# Patient Record
Sex: Male | Born: 1995 | Race: Black or African American | Hispanic: No | Marital: Single | Smoking: Never smoker
Health system: Southern US, Community
[De-identification: ages and names within clinical notes are randomized; demographics above are authoritative.]

## PROBLEM LIST (undated history)

## (undated) DIAGNOSIS — J45909 Unspecified asthma, uncomplicated: Secondary | ICD-10-CM

## (undated) HISTORY — PX: NASAL POLYP SURGERY: SHX186

---
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
## 2015-08-16 ENCOUNTER — Other Ambulatory Visit (HOSPITAL_COMMUNITY): Payer: Self-pay | Admitting: Internal Medicine

## 2015-08-16 DIAGNOSIS — R0602 Shortness of breath: Secondary | ICD-10-CM

## 2015-08-27 ENCOUNTER — Encounter (HOSPITAL_COMMUNITY): Payer: Self-pay

## 2015-08-27 ENCOUNTER — Ambulatory Visit (HOSPITAL_COMMUNITY): Payer: Medicaid Other | Attending: Internal Medicine

## 2015-09-10 ENCOUNTER — Inpatient Hospital Stay (HOSPITAL_COMMUNITY): Admission: RE | Admit: 2015-09-10 | Payer: Medicaid Other | Source: Ambulatory Visit

## 2021-11-10 ENCOUNTER — Ambulatory Visit
Admission: EM | Admit: 2021-11-10 | Discharge: 2021-11-10 | Disposition: A | Discharge status: Home / Self Care | Payer: Medicaid Other | Attending: Physician Assistant | Admitting: Physician Assistant

## 2021-11-10 DIAGNOSIS — R300 Dysuria: Secondary | ICD-10-CM | POA: Insufficient documentation

## 2021-11-10 DIAGNOSIS — Z113 Encounter for screening for infections with a predominantly sexual mode of transmission: Secondary | ICD-10-CM | POA: Diagnosis not present

## 2021-11-10 LAB — POCT URINALYSIS DIP (MANUAL ENTRY)
Bilirubin, UA: NEGATIVE
Glucose, UA: NEGATIVE mg/dL
Ketones, POC UA: NEGATIVE mg/dL
Nitrite, UA: NEGATIVE
Spec Grav, UA: >=1.03 — AB (ref 1.010–1.025)
Urobilinogen, UA: 0.2 E.U./dL
pH, UA: 6 (ref 5.0–8.0)

## 2021-11-10 MED ORDER — DOXYCYCLINE HYCLATE 100 MG PO CAPS: 100.0000 mg | ORAL_CAPSULE | Freq: Two times a day (BID) | ORAL | 0 refills | Status: DC

## 2021-11-10 NOTE — ED Triage Notes (Signed)
Pt states he has burning during urination and penile pain today.

## 2021-11-10 NOTE — ED Provider Notes (Signed)
[facility]    CSN: [id number] Arrival date & time: 11/10/21  1019      History   Chief Complaint Chief Complaint  Patient presents with   STD Testing     HPI Stanislaw Acton is a 26 y.o. male.   Patient here today for evaluation of dysuria and penile pain he noticed today.  He states that he did notice some white discharge from his penis earlier this morning.  He denies any genital lesions.  He has not had any fever.  Denies any known exposures to STDs.  The history is provided by the patient.    Past Medical History:  Diagnosis Date   Asthma     There are no problems to display for this patient.   Past Surgical History:  Procedure Laterality Date   NASAL POLYP SURGERY     ORCHIECTOMY Right 12/31/2012   Procedure: ORCHIECTOMY;  Surgeon: Molli Hazard, MD;  Location: [place];  Service: Urology;  Laterality: Right;  right simple orchiectomy; right spermatic cord block;  ultrasound   SCROTAL EXPLORATION N/A 12/31/2012   Procedure: SCROTUM EXPLORATION;  Surgeon: Molli Hazard, MD;  Location: [place];  Service: Urology;  Laterality: N/A;       Home Medications    Prior to Admission medications   Medication Sig Start Date End Date Taking? Authorizing Provider  doxycycline (VIBRAMYCIN) 100 MG capsule Take 1 capsule (100 mg total) by mouth 2 (two) times daily. 11/10/21  Yes Francene Finders, PA-C  albuterol (PROVENTIL HFA;VENTOLIN HFA) 108 (90 BASE) MCG/ACT inhaler Inhale 2 puffs into the lungs every 4 (four) hours as needed. Shortness of breath    [provider]  beclomethasone (QVAR) 40 MCG/ACT inhaler Inhale 2 puffs into the lungs 2 (two) times daily as needed. Shortness of breath    [provider]  ibuprofen (ADVIL,MOTRIN) 100 MG/5ML suspension Take 28.4 mLs (568 mg total) by mouth every 6 (six) hours as needed for fever or mild pain. 08/09/13   Isaac Bliss, MD    Family History Family History  Family history unknown:  Yes    Social History Social History   Tobacco Use   Smoking status: Never  Substance Use Topics   Alcohol use: No   Drug use: No     Allergies   Patient has no known allergies.   Review of Systems Review of Systems  Constitutional:  Negative for chills and fever.  Eyes:  Negative for discharge and redness.  Gastrointestinal:  Negative for abdominal pain, nausea and vomiting.  Genitourinary:  Positive for dysuria and penile pain.  Musculoskeletal:  Negative for back pain.  Neurological:  Negative for numbness.     Physical Exam Triage Vital Signs ED Triage Vitals  Enc Vitals Group     BP 11/10/21 1158 (!) 156/102     Pulse Rate 11/10/21 1158 (!) 101     Resp 11/10/21 1158 18     Temp 11/10/21 1158 98.5 F (36.9 C)     Temp Source 11/10/21 1158 Oral     SpO2 11/10/21 1158 98 %     Weight --      Height --      Head Circumference --      Peak Flow --      Pain Score 11/10/21 1159 5     Pain Loc --      Pain Edu? --      Excl. in [place]? --    No data  found.  Updated Vital Signs BP (!) 156/102 (BP Location: Left Arm)   Pulse (!) 101   Temp 98.5 F (36.9 C) (Oral)   Resp 18   SpO2 98%      Physical Exam Vitals and nursing note reviewed.  Constitutional:      General: He is not in acute distress.    Appearance: Normal appearance. He is not ill-appearing.  HENT:     Head: Normocephalic and atraumatic.  Eyes:     Conjunctiva/sclera: Conjunctivae normal.  Cardiovascular:     Rate and Rhythm: Normal rate.  Pulmonary:     Effort: Pulmonary effort is normal.  Neurological:     Mental Status: He is alert.  Psychiatric:        Mood and Affect: Mood normal.        Behavior: Behavior normal.        Thought Content: Thought content normal.      UC Treatments / Results  Labs (all labs ordered are listed, but only abnormal results are displayed) Labs Reviewed  POCT URINALYSIS DIP (MANUAL ENTRY) - Abnormal; Notable for the following components:       Result Value   Clarity, UA cloudy (*)    Spec Grav, UA >=1.030 (*)    Blood, UA trace-lysed (*)    Protein Ur, POC trace (*)    Leukocytes, UA Small (1+) (*)    All other components within normal limits  URINE CULTURE  CYTOLOGY, (ORAL, ANAL, URETHRAL) ANCILLARY ONLY    EKG   Radiology No results found.  Procedures Procedures (including critical care time)  Medications Ordered in UC Medications - No data to display  Initial Impression / Assessment and Plan / UC Course  I have reviewed the triage vital signs and the nursing notes.  Pertinent labs & imaging results that were available during my care of the patient were reviewed by me and considered in my medical decision making (see chart for details).     Doxycycline prescribed to cover both UTI as well as possible STD.  Will order STD screening as well as urine culture.  Recommended follow-up if no gradual improvement or with any further concerns.   Final Clinical Impressions(s) / UC Diagnoses   Final diagnoses:  Dysuria  Screening for STD (sexually transmitted disease)   Discharge Instructions   None    ED Prescriptions     Medication Sig Dispense Auth. Provider   doxycycline (VIBRAMYCIN) 100 MG capsule Take 1 capsule (100 mg total) by mouth 2 (two) times daily. 20 capsule Tomi Bamberger, PA-C      PDMP not reviewed this encounter.   Tomi Bamberger, PA-C 11/10/21 1304

## 2021-11-11 ENCOUNTER — Telehealth (HOSPITAL_COMMUNITY): Payer: Self-pay | Admitting: Emergency Medicine

## 2021-11-11 LAB — URINE CULTURE: Culture: NO GROWTH

## 2021-11-11 LAB — CYTOLOGY, (ORAL, ANAL, URETHRAL) ANCILLARY ONLY
Chlamydia: NEGATIVE
Comment: NEGATIVE
Comment: NEGATIVE
Comment: NORMAL
Neisseria Gonorrhea: POSITIVE — AB
Trichomonas: NEGATIVE

## 2021-11-11 NOTE — Telephone Encounter (Signed)
Per protocol, patient will need treatment with IM Rocephin 500mg  for positive Gonorrhea.   Contacted patient by phone.  Verified identity using two identifiers.  Provided positive result.  Reviewed safe sex practices, notifying partners, and refraining from sexual activities for 7 days from time of treatment.  Patient verified understanding, all questions answered.

## 2021-11-12 ENCOUNTER — Ambulatory Visit
Admission: EM | Admit: 2021-11-12 | Discharge: 2021-11-12 | Disposition: A | Discharge status: Home / Self Care | Payer: Medicaid Other | Attending: Internal Medicine | Admitting: Internal Medicine

## 2021-11-12 DIAGNOSIS — A549 Gonococcal infection, unspecified: Secondary | ICD-10-CM | POA: Diagnosis not present

## 2021-11-12 MED ORDER — CEFTRIAXONE SODIUM 500 MG IJ SOLR
500.0000 mg | INTRAMUSCULAR | Status: DC
  Administered 2021-11-12: 500 mg via INTRAMUSCULAR

## 2021-11-12 NOTE — ED Triage Notes (Signed)
Pt here for rn visit sti tx. Medication ordered and administered per protocol without complication. Education provided for safe sex.

## 2022-07-27 ENCOUNTER — Encounter (HOSPITAL_COMMUNITY): Payer: Self-pay

## 2022-07-27 ENCOUNTER — Emergency Department (HOSPITAL_COMMUNITY)
Admission: EM | Admit: 2022-07-27 | Discharge: 2022-07-27 | Disposition: A | Discharge status: Home / Self Care | Payer: Medicare Other | Attending: Emergency Medicine | Admitting: Emergency Medicine

## 2022-07-27 ENCOUNTER — Other Ambulatory Visit: Payer: Self-pay

## 2022-07-27 DIAGNOSIS — L02214 Cutaneous abscess of groin: Secondary | ICD-10-CM | POA: Diagnosis present

## 2022-07-27 DIAGNOSIS — I10 Essential (primary) hypertension: Secondary | ICD-10-CM | POA: Diagnosis not present

## 2022-07-27 DIAGNOSIS — L0291 Cutaneous abscess, unspecified: Secondary | ICD-10-CM

## 2022-07-27 MED ORDER — OXYCODONE-ACETAMINOPHEN 5-325 MG PO TABS
1.0000 | ORAL_TABLET | Freq: Once | ORAL | Status: AC
  Administered 2022-07-27: 1 via ORAL
  Filled 2022-07-27: qty 1

## 2022-07-27 MED ORDER — LIDOCAINE-EPINEPHRINE (PF) 2 %-1:200000 IJ SOLN
10.0000 mL | Freq: Once | INTRAMUSCULAR | Status: AC
  Administered 2022-07-27: 10 mL
  Filled 2022-07-27: qty 20

## 2022-07-27 MED ORDER — IBUPROFEN 600 MG PO TABS: 600.0000 mg | ORAL_TABLET | Freq: Four times a day (QID) | ORAL | 0 refills | Status: AC | PRN

## 2022-07-27 MED ORDER — DOXYCYCLINE HYCLATE 100 MG PO CAPS: 100.0000 mg | ORAL_CAPSULE | Freq: Two times a day (BID) | ORAL | 0 refills | Status: AC

## 2022-07-27 NOTE — ED Notes (Signed)
DC instructions reviewed with pt. PT verbalized understanding. PT DC °

## 2022-07-27 NOTE — Discharge Instructions (Signed)
As discussed, your abscess was drained while in the emergency department.  Recommend washing area gently with warm soapy water.  Avoid placing lotion or ointment or other products in opening of abscess.  Given area of redness, will place on antibiotics for the next 5 to 6 days.  Recommend taking Tylenol/Motrin for pain.  Please do not hesitate to return to emergency department if worrisome signs and symptoms we discussed become apparent.

## 2022-07-27 NOTE — ED Triage Notes (Signed)
pt c/o abscess on L groin x 2-3 days; painful, red; denies drainage; denies fevers; denies hx of same

## 2022-07-27 NOTE — ED Provider Notes (Signed)
[place] EMERGENCY DEPARTMENT AT [facility] Provider Note   CSN: [id number] Arrival date & time: 07/27/22  4034     History  No chief complaint on file.   Eric [name] is a 27 y.o. male.  HPI   27 year old male presents emergency department with complaints of abscess.  Patient states he noticed a lump in his left pubic area 2 days ago which is continued to swell, cause pain and appear red looking.  Denies history of similar symptoms in the past.  States he has been trying at home compresses which have not caused the area to drain.  Denies any fever, chills, urinary symptoms, change in bowel habits.  Past medical history significant for testicular torsion  Home Medications Prior to Admission medications   Medication Sig Start Date End Date Taking? Authorizing Provider  doxycycline (VIBRAMYCIN) 100 MG capsule Take 1 capsule (100 mg total) by mouth 2 (two) times daily. 07/27/22  Yes Sherian Maroon A, PA  ibuprofen (ADVIL) 600 MG tablet Take 1 tablet (600 mg total) by mouth every 6 (six) hours as needed. 07/27/22  Yes Sherian Maroon A, PA  albuterol (PROVENTIL HFA;VENTOLIN HFA) 108 (90 BASE) MCG/ACT inhaler Inhale 2 puffs into the lungs every 4 (four) hours as needed. Shortness of breath    [provider]  beclomethasone (QVAR) 40 MCG/ACT inhaler Inhale 2 puffs into the lungs 2 (two) times daily as needed. Shortness of breath    [provider]      Allergies    Patient has no known allergies.    Review of Systems   Review of Systems  All other systems reviewed and are negative.   Physical Exam Updated Vital Signs BP (!) 172/112 (BP Location: Right Arm)   Pulse 78   Temp 97.8 F (36.6 C) (Oral)   Resp 16   SpO2 100%  Physical Exam Vitals and nursing note reviewed.  Constitutional:      General: He is not in acute distress.    Appearance: He is well-developed.  HENT:     Head: Normocephalic and atraumatic.  Eyes:      Conjunctiva/sclera: Conjunctivae normal.  Cardiovascular:     Rate and Rhythm: Normal rate and regular rhythm.     Heart sounds: No murmur heard. Pulmonary:     Effort: Pulmonary effort is normal. No respiratory distress.     Breath sounds: Normal breath sounds.  Abdominal:     Palpations: Abdomen is soft.     Tenderness: There is no abdominal tenderness.  Genitourinary:   Musculoskeletal:        General: No swelling.     Cervical back: Neck supple.  Skin:    General: Skin is warm and dry.     Capillary Refill: Capillary refill takes less than 2 seconds.     Comments: Patient with area of induration and pubic area on the left side just medial of inguinal fold.  No obvious palpable pulse/thrill.  Surrounding erythema.  Possible fluctuance at the center most part.  Area measures approximately 2.6 cm in diameter.  Neurological:     Mental Status: He is alert.  Psychiatric:        Mood and Affect: Mood normal.     ED Results / Procedures / Treatments   Labs (all labs ordered are listed, but only abnormal results are displayed) Labs Reviewed - No data to display  EKG None  Radiology No results found.  Procedures .Marland KitchenIncision and Drainage  Date/Time: 07/27/2022 11:29  AM  Performed by: Peter Garter, PA Authorized by: Peter Garter, PA   Consent:    Consent obtained:  Verbal   Consent given by:  Patient   Risks, benefits, and alternatives were discussed: yes     Risks discussed:  Bleeding, damage to other organs, infection, incomplete drainage and pain   Alternatives discussed:  No treatment, delayed treatment, alternative treatment, observation and referral Universal protocol:    Procedure explained and questions answered to patient or proxy's satisfaction: yes     Patient identity confirmed:  Verbally with patient and arm band Location:    Type:  Abscess   Size:  2.6   Location:  Anogenital     Medications Ordered in ED Medications   oxyCODONE-acetaminophen (PERCOCET/ROXICET) 5-325 MG per tablet 1 tablet (1 tablet Oral Given 07/27/22 1016)  lidocaine-EPINEPHrine (XYLOCAINE W/EPI) 2 %-1:200000 (PF) injection 10 mL (10 mLs Infiltration Given 07/27/22 1017)    ED Course/ Medical Decision Making/ A&P                             Medical Decision Making Risk Prescription drug management.   This patient presents to the ED for concern of abscess, this involves an extensive number of treatment options, and is a complaint that carries with it a high risk of complications and morbidity.  The differential diagnosis includes abscess, Fournier's gangrene, cellulitis, erysipelas, hernia   Co morbidities that complicate the patient evaluation  See HPI   Additional history obtained:  Additional history obtained from EMR External records from outside source obtained and reviewed including hospital records   Lab Tests:  N/a   Imaging Studies ordered:  N/a   Cardiac Monitoring: / EKG:  The patient was maintained on a cardiac monitor.  I personally viewed and interpreted the cardiac monitored which showed an underlying rhythm of: Sinus rhythm   Consultations Obtained:  N/a   Problem List / ED Course / Critical interventions / Medication management  Abscess I ordered medication including Percocet, lidocaine with epinephrine Reevaluation of the patient after these medicines showed that the patient improved I have reviewed the patients home medicines and have made adjustments as needed   Social Determinants of Health:  Denies tobacco, licit drug use   Test / Admission - Considered:  Abscess Vitals signs significant for initial hypertension with blood pressure 172/112 of which decreases, labs and medicines been stable on emergency department.. Otherwise within normal range and stable throughout visit. 27 year old male presents emergency department complaints of abscess in left inguinal region.  Patient  found with evidence of abscess near pubic symphysis on the left side just medial to inguinal region.  Area drained and manner inspected above.  Patient tolerated procedure well without immediate difficulties.  Given surrounding cellulitic skin changes, will place patient on antibiotics in the form of doxycycline and recommend follow-up with primary care in the outpatient setting.  Patient without meeting SIRS criteria for sepsis protocol was not performed.  Treatment plan discussed at length with patient acknowledged understanding was agreeable to set plan.  Patient well-appearing, afebrile in no acute distress. Worrisome signs and symptoms were discussed with the patient, and the patient acknowledged understanding to return to the ED if noticed. Patient was stable upon discharge.          Final Clinical Impression(s) / ED Diagnoses Final diagnoses:  Abscess    Rx / DC Orders ED Discharge Orders  Ordered    doxycycline (VIBRAMYCIN) 100 MG capsule  2 times daily        07/27/22 1126    ibuprofen (ADVIL) 600 MG tablet  Every 6 hours PRN        07/27/22 1126              Peter Garter, [place] 07/27/22 1135    Gwyneth Sprout, MD 07/30/22 2330
